# Patient Record
Sex: Female | Born: 1960 | Race: White | Hispanic: No | Marital: Married | Smoking: Never smoker
Health system: Southern US, Community
[De-identification: ages and names within clinical notes are randomized; demographics above are authoritative.]

## PROBLEM LIST (undated history)

## (undated) DIAGNOSIS — K219 Gastro-esophageal reflux disease without esophagitis: Secondary | ICD-10-CM

## (undated) DIAGNOSIS — F988 Other specified behavioral and emotional disorders with onset usually occurring in childhood and adolescence: Secondary | ICD-10-CM

## (undated) DIAGNOSIS — F419 Anxiety disorder, unspecified: Secondary | ICD-10-CM

## (undated) DIAGNOSIS — D649 Anemia, unspecified: Secondary | ICD-10-CM

## (undated) DIAGNOSIS — I10 Essential (primary) hypertension: Secondary | ICD-10-CM

## (undated) DIAGNOSIS — M5030 Other cervical disc degeneration, unspecified cervical region: Secondary | ICD-10-CM

## (undated) HISTORY — DX: Other specified behavioral and emotional disorders with onset usually occurring in childhood and adolescence: F98.8

## (undated) HISTORY — DX: Anxiety disorder, unspecified: F41.9

## (undated) HISTORY — DX: Anemia, unspecified: D64.9

## (undated) HISTORY — DX: Other cervical disc degeneration, unspecified cervical region: M50.30

## (undated) HISTORY — DX: Essential (primary) hypertension: I10

## (undated) HISTORY — DX: Gastro-esophageal reflux disease without esophagitis: K21.9

## (undated) HISTORY — PX: OTHER SURGICAL HISTORY: SHX169

---

## 1998-03-11 ENCOUNTER — Other Ambulatory Visit: Admission: RE | Admit: 1998-03-11 | Discharge: 1998-03-11 | Payer: Self-pay | Admitting: Obstetrics and Gynecology

## 1999-03-26 ENCOUNTER — Other Ambulatory Visit: Admission: RE | Admit: 1999-03-26 | Discharge: 1999-03-26 | Payer: Self-pay | Admitting: Obstetrics and Gynecology

## 2000-04-27 ENCOUNTER — Other Ambulatory Visit: Admission: RE | Admit: 2000-04-27 | Discharge: 2000-04-27 | Payer: Self-pay | Admitting: Obstetrics and Gynecology

## 2001-02-15 ENCOUNTER — Ambulatory Visit (HOSPITAL_BASED_OUTPATIENT_CLINIC_OR_DEPARTMENT_OTHER): Admission: RE | Admit: 2001-02-15 | Discharge: 2001-02-15 | Payer: Self-pay | Admitting: Orthopedic Surgery

## 2001-02-16 ENCOUNTER — Inpatient Hospital Stay (HOSPITAL_COMMUNITY): Admission: AD | Admit: 2001-02-16 | Discharge: 2001-02-17 | Payer: Self-pay | Admitting: Orthopedic Surgery

## 2001-02-17 ENCOUNTER — Encounter: Payer: Self-pay | Admitting: Orthopedic Surgery

## 2001-05-11 ENCOUNTER — Emergency Department (HOSPITAL_COMMUNITY): Admission: AC | Admit: 2001-05-11 | Discharge: 2001-05-11 | Payer: Self-pay

## 2001-05-15 ENCOUNTER — Other Ambulatory Visit: Admission: RE | Admit: 2001-05-15 | Discharge: 2001-05-15 | Payer: Self-pay | Admitting: Obstetrics and Gynecology

## 2001-05-17 ENCOUNTER — Ambulatory Visit (HOSPITAL_BASED_OUTPATIENT_CLINIC_OR_DEPARTMENT_OTHER): Admission: RE | Admit: 2001-05-17 | Discharge: 2001-05-17 | Payer: Self-pay | Admitting: Orthopedic Surgery

## 2002-05-16 ENCOUNTER — Other Ambulatory Visit: Admission: RE | Admit: 2002-05-16 | Discharge: 2002-05-16 | Payer: Self-pay | Admitting: Obstetrics and Gynecology

## 2003-05-18 ENCOUNTER — Emergency Department (HOSPITAL_COMMUNITY): Admission: EM | Admit: 2003-05-18 | Discharge: 2003-05-19 | Payer: Self-pay | Admitting: Emergency Medicine

## 2003-05-18 ENCOUNTER — Encounter: Payer: Self-pay | Admitting: Emergency Medicine

## 2003-07-11 ENCOUNTER — Other Ambulatory Visit: Admission: RE | Admit: 2003-07-11 | Discharge: 2003-07-11 | Payer: Self-pay | Admitting: Obstetrics and Gynecology

## 2004-09-14 ENCOUNTER — Other Ambulatory Visit: Admission: RE | Admit: 2004-09-14 | Discharge: 2004-09-14 | Payer: Self-pay | Admitting: Obstetrics and Gynecology

## 2004-11-05 ENCOUNTER — Observation Stay (HOSPITAL_COMMUNITY): Admission: RE | Admit: 2004-11-05 | Discharge: 2004-11-06 | Payer: Self-pay | Admitting: Obstetrics and Gynecology

## 2005-06-28 ENCOUNTER — Encounter: Admission: RE | Admit: 2005-06-28 | Discharge: 2005-06-28 | Payer: Self-pay | Admitting: Family Medicine

## 2005-10-21 ENCOUNTER — Other Ambulatory Visit: Admission: RE | Admit: 2005-10-21 | Discharge: 2005-10-21 | Payer: Self-pay | Admitting: Obstetrics and Gynecology

## 2011-06-29 ENCOUNTER — Other Ambulatory Visit: Payer: Self-pay | Admitting: Dermatology

## 2011-07-26 ENCOUNTER — Other Ambulatory Visit: Payer: Self-pay | Admitting: Dermatology

## 2012-10-26 ENCOUNTER — Encounter: Payer: Self-pay | Admitting: Gastroenterology

## 2012-11-09 ENCOUNTER — Ambulatory Visit (AMBULATORY_SURGERY_CENTER): Payer: BC Managed Care – PPO | Admitting: *Deleted

## 2012-11-09 VITALS — Ht 62.0 in | Wt 157.2 lb

## 2012-11-09 DIAGNOSIS — Z1211 Encounter for screening for malignant neoplasm of colon: Secondary | ICD-10-CM

## 2012-11-09 MED ORDER — NA SULFATE-K SULFATE-MG SULF 17.5-3.13-1.6 GM/177ML PO SOLN: ORAL | Status: DC

## 2012-11-13 ENCOUNTER — Encounter: Payer: Self-pay | Admitting: Gastroenterology

## 2012-11-30 ENCOUNTER — Ambulatory Visit (AMBULATORY_SURGERY_CENTER): Payer: BC Managed Care – PPO | Admitting: Gastroenterology

## 2012-11-30 ENCOUNTER — Encounter: Payer: Self-pay | Admitting: Gastroenterology

## 2012-11-30 VITALS — BP 149/100 | HR 76 | Temp 98.6°F | Resp 14 | Ht 62.0 in | Wt 157.0 lb

## 2012-11-30 DIAGNOSIS — Z1211 Encounter for screening for malignant neoplasm of colon: Secondary | ICD-10-CM

## 2012-11-30 MED ORDER — SODIUM CHLORIDE 0.9 % IV SOLN: 500.0000 mL | INTRAVENOUS | Status: DC

## 2012-11-30 NOTE — Patient Instructions (Addendum)

## 2012-11-30 NOTE — Progress Notes (Signed)
Patient did not experience any of the following events: a burn prior to discharge; a fall within the facility; wrong site/side/patient/procedure/implant event; or a hospital transfer or hospital admission upon discharge from the facility. (G8907) Patient did not have preoperative order for IV antibiotic SSI prophylaxis. (G8918)  

## 2012-11-30 NOTE — Op Note (Signed)
[facility] [address] [place], [address]   COLONOSCOPY PROCEDURE REPORT  PATIENT: Cassandra Barnett, Cassandra Barnett  MR#: [id number] BIRTHDATE: 08-Aug-1961 , 51  yrs. old GENDER: Female ENDOSCOPIST: Meryl Dare, MD, [facility] REFERRED OZ:HYQMV Kevan Ny, M.D. PROCEDURE DATE:  11/30/2012 PROCEDURE:   Colonoscopy, screening ASA CLASS:   Class II INDICATIONS:average risk screening. MEDICATIONS: MAC sedation, administered by CRNA, propofol (Diprivan) 300mg  IV DESCRIPTION OF PROCEDURE:   After the risks benefits and alternatives of the procedure were thoroughly explained, informed consent was obtained.  A digital rectal exam revealed no abnormalities of the rectum.   The LB [id number]  endoscope was introduced through the anus and advanced to the cecum, which was identified by both the appendix and ileocecal valve. No adverse events experienced.   The quality of the prep was good, using MoviPrep  The instrument was then slowly withdrawn as the colon was fully examined.  COLON FINDINGS: Mild diverticulosis was noted in the sigmoid colon. The colon was otherwise normal.  There was no diverticulosis, inflammation, polyps or cancers unless previously stated. Retroflexed views revealed no abnormalities. The time to cecum=3 minutes 23 seconds.  Withdrawal time=10 minutes 55 seconds.  The scope was withdrawn and the procedure completed.  COMPLICATIONS: There were no complications.  ENDOSCOPIC IMPRESSION: 1.   Mild diverticulosis was noted in the sigmoid colon 2.   The colon was otherwise normal  RECOMMENDATIONS: 1.  High fiber diet with liberal fluid intake. 2.  Continue current colorectal screening recommendations for "routine risk" patients with a repeat colonoscopy in 10 years.   eSigned:  Meryl Dare, MD, [facility] 11/30/2012 11:17 AM

## 2012-12-01 ENCOUNTER — Telehealth: Payer: Self-pay | Admitting: *Deleted

## 2012-12-01 NOTE — Telephone Encounter (Signed)
  Follow up Call-  Call back number 11/30/2012  Post procedure Call Back phone  # [phone]  Permission to leave phone message Yes     Patient questions:  Do you have a fever, pain , or abdominal swelling? no Pain Score  0 *  Have you tolerated food without any problems? yes  Have you been able to return to your normal activities? yes  Do you have any questions about your discharge instructions: Diet   no Medications  no Follow up visit  no  Do you have questions or concerns about your Care? no  Actions: * If pain score is 4 or above: No action needed, pain <4.

## 2013-07-18 ENCOUNTER — Other Ambulatory Visit: Payer: Self-pay | Admitting: Dermatology

## 2013-10-21 ENCOUNTER — Encounter: Payer: Self-pay | Admitting: *Deleted

## 2013-10-21 DIAGNOSIS — I10 Essential (primary) hypertension: Secondary | ICD-10-CM

## 2013-10-21 DIAGNOSIS — F419 Anxiety disorder, unspecified: Secondary | ICD-10-CM

## 2014-10-15 ENCOUNTER — Other Ambulatory Visit: Payer: Self-pay | Admitting: Obstetrics and Gynecology

## 2014-10-16 LAB — CYTOLOGY - PAP

## 2016-10-04 DIAGNOSIS — G4700 Insomnia, unspecified: Secondary | ICD-10-CM | POA: Diagnosis not present

## 2016-10-04 DIAGNOSIS — I10 Essential (primary) hypertension: Secondary | ICD-10-CM | POA: Diagnosis not present

## 2016-10-04 DIAGNOSIS — E7800 Pure hypercholesterolemia, unspecified: Secondary | ICD-10-CM | POA: Diagnosis not present

## 2016-12-13 DIAGNOSIS — Z01419 Encounter for gynecological examination (general) (routine) without abnormal findings: Secondary | ICD-10-CM | POA: Diagnosis not present

## 2017-02-23 DIAGNOSIS — D225 Melanocytic nevi of trunk: Secondary | ICD-10-CM | POA: Diagnosis not present

## 2017-02-23 DIAGNOSIS — L821 Other seborrheic keratosis: Secondary | ICD-10-CM | POA: Diagnosis not present

## 2017-02-23 DIAGNOSIS — L814 Other melanin hyperpigmentation: Secondary | ICD-10-CM | POA: Diagnosis not present

## 2017-04-04 DIAGNOSIS — E7800 Pure hypercholesterolemia, unspecified: Secondary | ICD-10-CM | POA: Diagnosis not present

## 2017-04-04 DIAGNOSIS — I10 Essential (primary) hypertension: Secondary | ICD-10-CM | POA: Diagnosis not present

## 2017-06-13 DIAGNOSIS — M79644 Pain in right finger(s): Secondary | ICD-10-CM | POA: Diagnosis not present

## 2017-06-13 DIAGNOSIS — S61212D Laceration without foreign body of right middle finger without damage to nail, subsequent encounter: Secondary | ICD-10-CM | POA: Diagnosis not present

## 2017-06-13 DIAGNOSIS — S61212A Laceration without foreign body of right middle finger without damage to nail, initial encounter: Secondary | ICD-10-CM | POA: Diagnosis not present

## 2017-06-21 DIAGNOSIS — M79644 Pain in right finger(s): Secondary | ICD-10-CM | POA: Diagnosis not present

## 2017-06-21 DIAGNOSIS — S61212A Laceration without foreign body of right middle finger without damage to nail, initial encounter: Secondary | ICD-10-CM | POA: Diagnosis not present

## 2017-10-06 DIAGNOSIS — E7800 Pure hypercholesterolemia, unspecified: Secondary | ICD-10-CM | POA: Diagnosis not present

## 2017-10-06 DIAGNOSIS — I10 Essential (primary) hypertension: Secondary | ICD-10-CM | POA: Diagnosis not present

## 2017-12-20 DIAGNOSIS — Z6829 Body mass index (BMI) 29.0-29.9, adult: Secondary | ICD-10-CM | POA: Diagnosis not present

## 2017-12-20 DIAGNOSIS — Z01419 Encounter for gynecological examination (general) (routine) without abnormal findings: Secondary | ICD-10-CM | POA: Diagnosis not present

## 2018-01-11 DIAGNOSIS — R609 Edema, unspecified: Secondary | ICD-10-CM | POA: Diagnosis not present

## 2018-02-08 DIAGNOSIS — M5412 Radiculopathy, cervical region: Secondary | ICD-10-CM | POA: Diagnosis not present

## 2018-04-06 DIAGNOSIS — I10 Essential (primary) hypertension: Secondary | ICD-10-CM | POA: Diagnosis not present

## 2018-04-06 DIAGNOSIS — E7800 Pure hypercholesterolemia, unspecified: Secondary | ICD-10-CM | POA: Diagnosis not present

## 2018-06-14 DIAGNOSIS — T783XXA Angioneurotic edema, initial encounter: Secondary | ICD-10-CM | POA: Diagnosis not present

## 2018-06-24 DIAGNOSIS — Z23 Encounter for immunization: Secondary | ICD-10-CM | POA: Diagnosis not present

## 2018-08-02 DIAGNOSIS — D225 Melanocytic nevi of trunk: Secondary | ICD-10-CM | POA: Diagnosis not present

## 2018-08-02 DIAGNOSIS — L814 Other melanin hyperpigmentation: Secondary | ICD-10-CM | POA: Diagnosis not present

## 2018-08-02 DIAGNOSIS — L821 Other seborrheic keratosis: Secondary | ICD-10-CM | POA: Diagnosis not present

## 2018-09-25 DIAGNOSIS — Z23 Encounter for immunization: Secondary | ICD-10-CM | POA: Diagnosis not present

## 2018-10-09 DIAGNOSIS — G4700 Insomnia, unspecified: Secondary | ICD-10-CM | POA: Diagnosis not present

## 2018-10-09 DIAGNOSIS — I10 Essential (primary) hypertension: Secondary | ICD-10-CM | POA: Diagnosis not present

## 2019-01-04 DIAGNOSIS — Z01419 Encounter for gynecological examination (general) (routine) without abnormal findings: Secondary | ICD-10-CM | POA: Diagnosis not present

## 2019-01-04 DIAGNOSIS — Z6829 Body mass index (BMI) 29.0-29.9, adult: Secondary | ICD-10-CM | POA: Diagnosis not present

## 2019-01-09 DIAGNOSIS — L989 Disorder of the skin and subcutaneous tissue, unspecified: Secondary | ICD-10-CM | POA: Diagnosis not present

## 2019-01-09 DIAGNOSIS — B078 Other viral warts: Secondary | ICD-10-CM | POA: Diagnosis not present

## 2019-04-18 ENCOUNTER — Other Ambulatory Visit: Payer: Self-pay

## 2019-04-18 DIAGNOSIS — Z20822 Contact with and (suspected) exposure to covid-19: Secondary | ICD-10-CM

## 2019-04-20 ENCOUNTER — Telehealth: Payer: Self-pay | Admitting: Family Medicine

## 2019-04-20 LAB — NOVEL CORONAVIRUS, NAA: SARS-CoV-2, NAA: NOT DETECTED

## 2019-04-20 NOTE — Telephone Encounter (Signed)
Negative COVID results given. Patient results "NOT Detected." Caller expressed understanding. ° °

## 2019-06-01 ENCOUNTER — Ambulatory Visit
Admission: RE | Admit: 2019-06-01 | Discharge: 2019-06-01 | Disposition: A | Discharge status: Home / Self Care | Payer: BC Managed Care – PPO | Source: Ambulatory Visit | Attending: Family Medicine | Admitting: Family Medicine

## 2019-06-01 ENCOUNTER — Other Ambulatory Visit: Payer: Self-pay | Admitting: Family Medicine

## 2019-06-01 ENCOUNTER — Other Ambulatory Visit: Payer: Self-pay

## 2019-06-01 DIAGNOSIS — S9031XA Contusion of right foot, initial encounter: Secondary | ICD-10-CM

## 2019-09-20 ENCOUNTER — Other Ambulatory Visit: Payer: BC Managed Care – PPO

## 2019-11-11 ENCOUNTER — Ambulatory Visit: Payer: 59 | Attending: Internal Medicine

## 2019-11-11 DIAGNOSIS — Z23 Encounter for immunization: Secondary | ICD-10-CM | POA: Insufficient documentation

## 2019-11-11 NOTE — Progress Notes (Signed)
   Covid-19 Vaccination Clinic  Name:  Cassandra Barnett    MRN: [id number] DOB: [birth year]  11/11/2019  [name] was observed post Covid-19 immunization for 15 minutes without incident. She was provided with Vaccine Information Sheet and instruction to access the V-Safe system.   [name] was instructed to call 911 with any severe reactions post vaccine: Marland Kitchen Difficulty breathing  . Swelling of face and throat  . A fast heartbeat  . A bad rash all over body  . Dizziness and weakness   Immunizations Administered    Name Date Dose VIS Date Route   Pfizer COVID-19 Vaccine 11/11/2019  3:35 PM 0.3 mL 08/17/2019 Intramuscular   Manufacturer: [place]   Lot: [id number]   [place]: [id number]

## 2019-12-11 ENCOUNTER — Ambulatory Visit: Payer: 59 | Attending: Internal Medicine

## 2019-12-11 DIAGNOSIS — Z23 Encounter for immunization: Secondary | ICD-10-CM

## 2019-12-11 NOTE — Progress Notes (Signed)
   Covid-19 Vaccination Clinic  Name:  Cassandra Barnett    MRN: [id number] DOB: Jan 22, 1961  12/11/2019  [name] was observed post Covid-19 immunization for 15 minutes without incident. She was provided with Vaccine Information Sheet and instruction to access the V-Safe system.   [name] was instructed to call 911 with any severe reactions post vaccine: Marland Kitchen Difficulty breathing  . Swelling of face and throat  . A fast heartbeat  . A bad rash all over body  . Dizziness and weakness   Immunizations Administered    Name Date Dose VIS Date Route   Pfizer COVID-19 Vaccine 12/11/2019  1:36 PM 0.3 mL 08/17/2019 Intramuscular   Manufacturer: [place]   Lot: [id number]   [place]: [id number]

## 2019-12-24 ENCOUNTER — Ambulatory Visit: Payer: Self-pay | Admitting: *Deleted

## 2019-12-24 NOTE — Telephone Encounter (Signed)
Patient called [facility]- thinking it is still UC- advised now Biomedical engineer. Patient advised ED and she got upset and said she would have to wait too long- patient said she would find somewhere else and hung up.  Reason for Disposition . [1] Cut (length > 1/8 inch or 3 mm) or skin tear AND [2] any animal  Answer Assessment - Initial Assessment Questions 1. ANIMAL: "What type of animal caused the bite?" "Is the injury from a bite or a claw?" If the animal is a dog or a cat, ask: "Was it a pet or a stray?" "Was it acting ill or behaving strangely?"     Dog bite- patient has rescue dog- not acting strangely 2. LOCATION: "Where is the bite located?"      L arm and hand 3. SIZE: "How big is the bite?" "What does it look like?"      More than one- tears in the skin 4. ONSET: "When did the bite happen?" (Minutes or hours ago)      6:30 last night 5. CIRCUMSTANCES: "Tell me how this happened."      Rescue dog- patient reached for dog- she went to redirect dog 6. TETANUS: "When was the last tetanus booster?"     Not been long 7. PREGNANCY: "Is there any chance you are pregnant?" "When was your last menstrual period?"     n/a  Protocols used: ANIMAL BITE-A-AH

## 2020-08-06 IMAGING — CR DG FOOT COMPLETE 3+V*R*
3 series · 3 of 3 positions shown · non-contrast
Comparison: None.

CLINICAL DATA: Right foot contusion, pain in the third and fourth
metatarsal heads

EXAM:
RIGHT FOOT COMPLETE - 3+ VIEW

[x foot ap right]
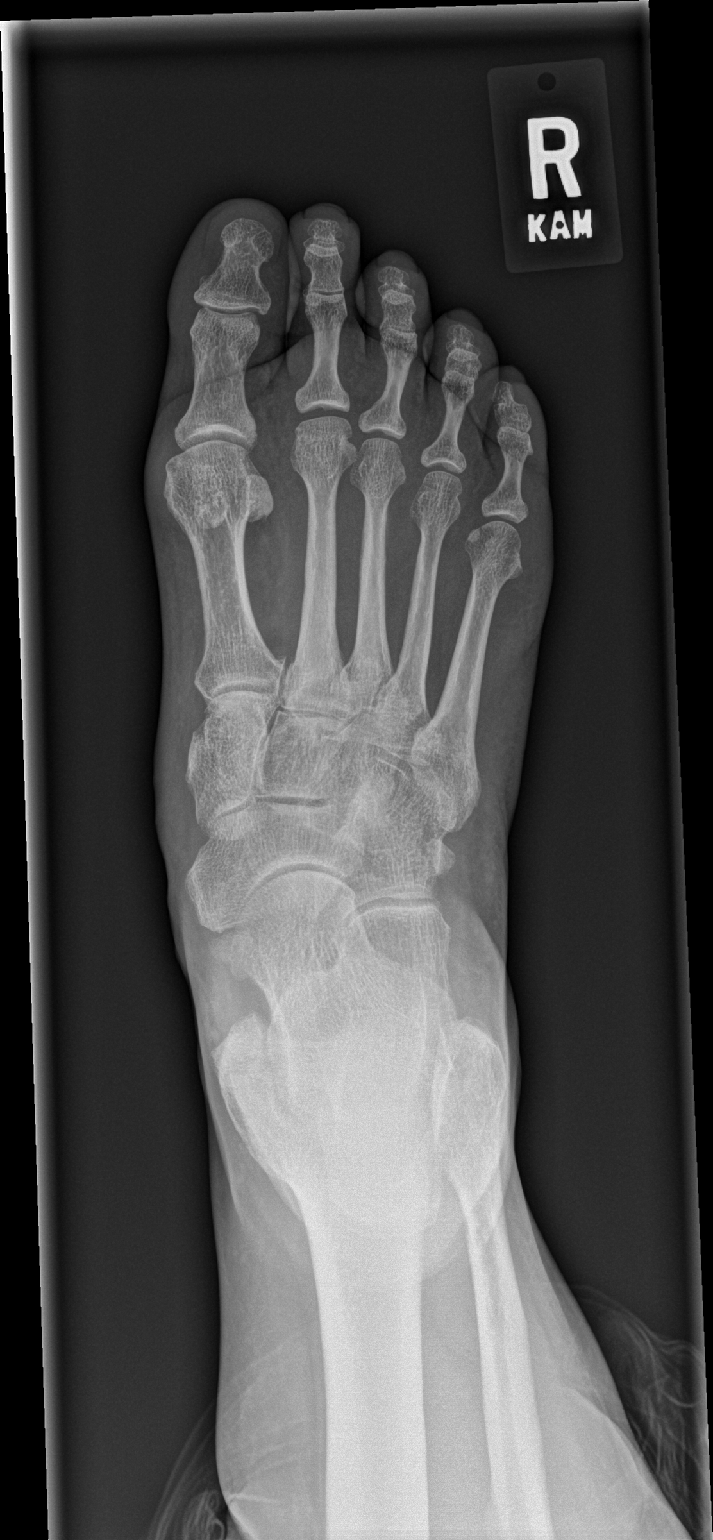

[x foot obl right]
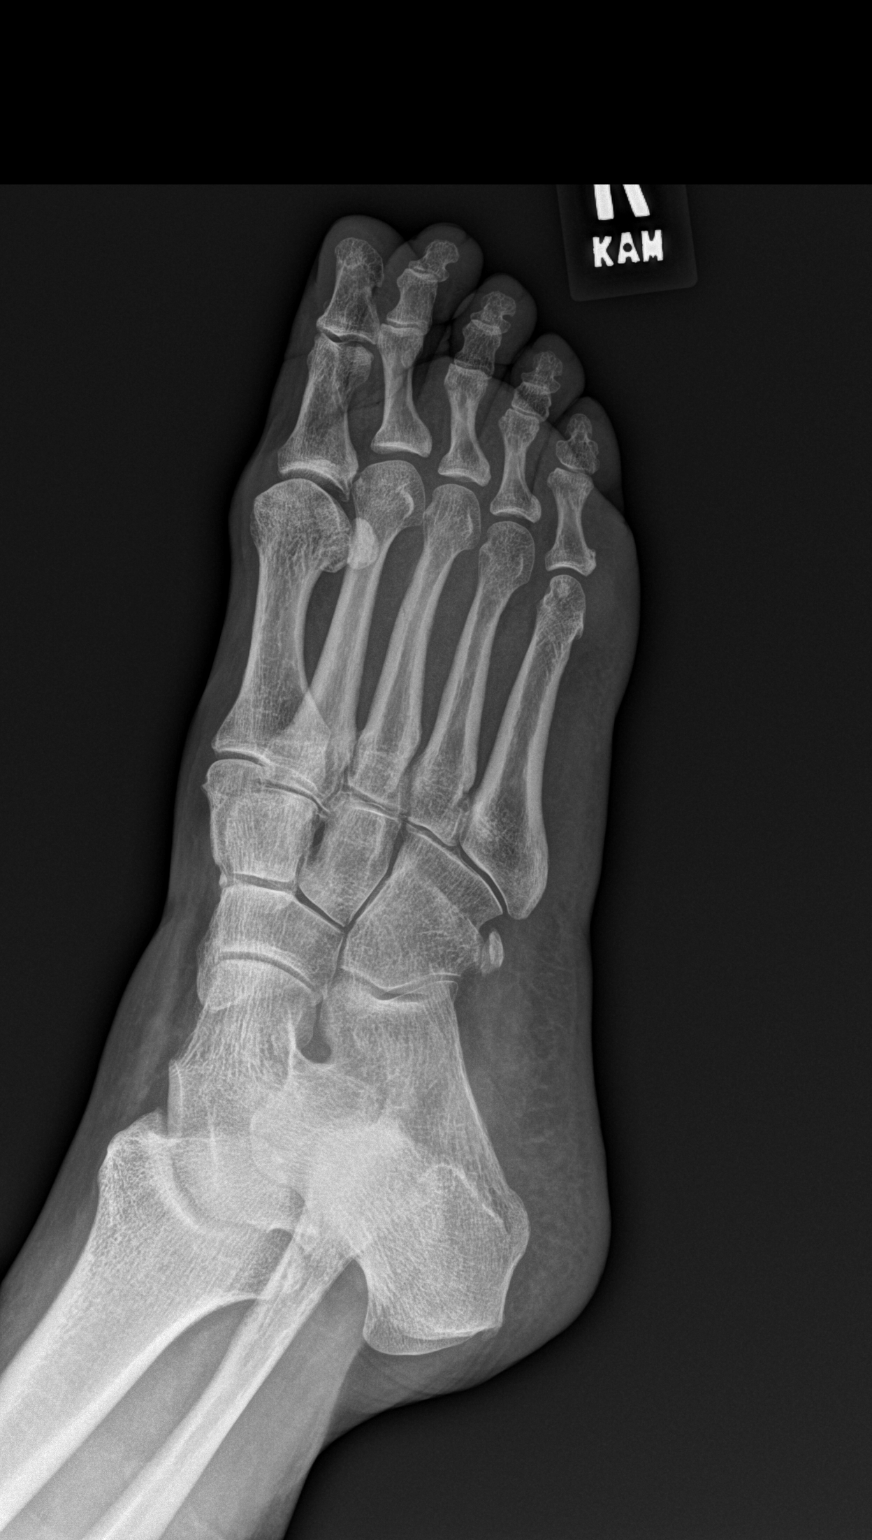

[x foot lat right]
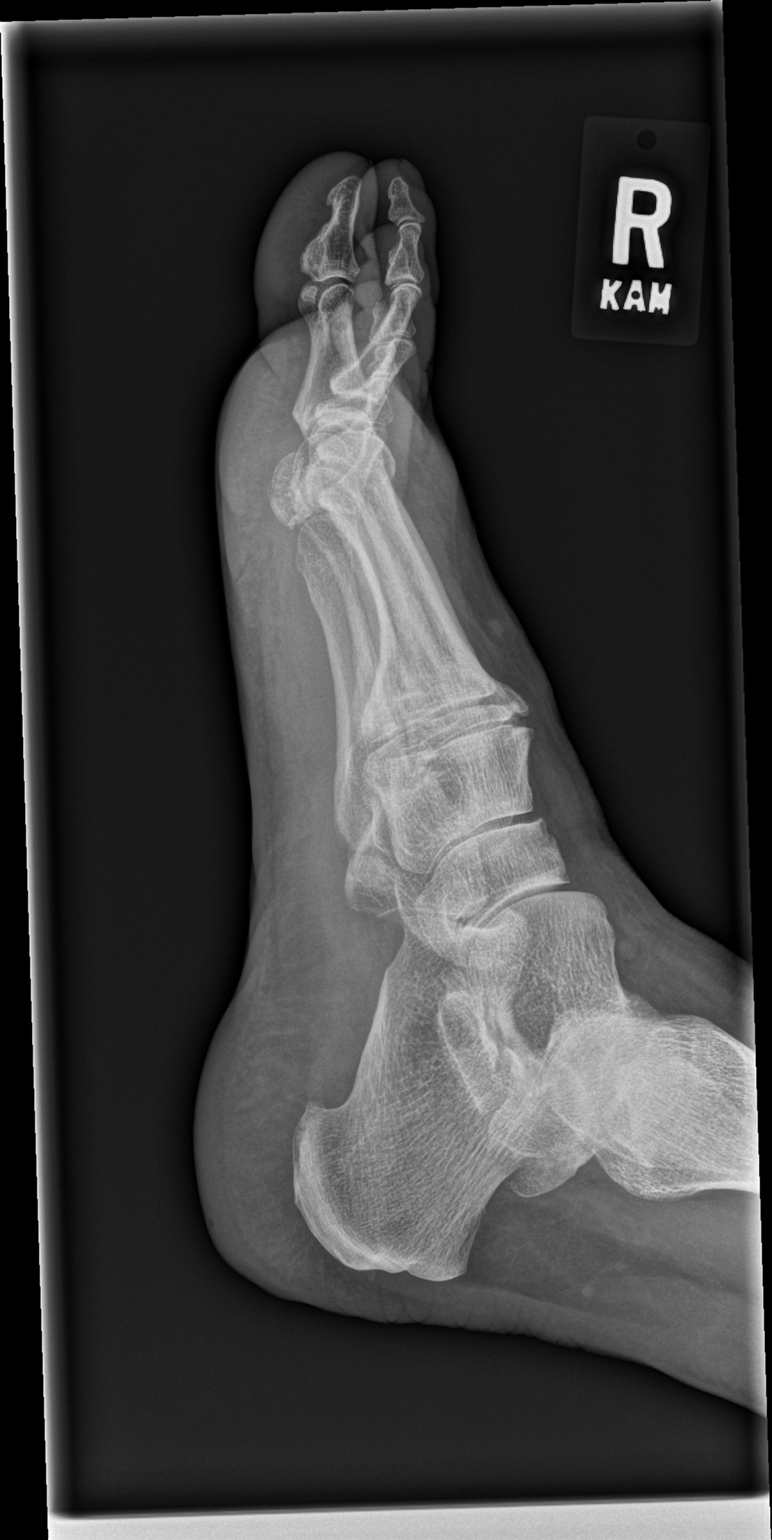

[3 of 3 positions shown; findings below may reference images not displayed]

FINDINGS: No definite fracture or dislocation of the right foot. On oblique
view, there is a very subtle, oblique lucency of the medial aspect
of the cortex of the right third metatarsal. Mild first
metatarsophalangeal and midfoot arthrosis. Diffuse soft tissue edema
of the forefoot.
IMPRESSION: 1. No definite fracture or dislocation of the right foot. On oblique
view, there is a very subtle, oblique lucency of the medial aspect
of the cortex of the right third metatarsal. This is suspicious for
subtle nondisplaced fracture versus nutrient foramen.

2.  Diffuse soft tissue edema of the forefoot.

## 2022-02-24 ENCOUNTER — Other Ambulatory Visit: Payer: Self-pay

## 2022-02-25 ENCOUNTER — Ambulatory Visit: Payer: 59 | Admitting: Nurse Practitioner

## 2022-02-25 ENCOUNTER — Encounter: Payer: Self-pay | Admitting: Nurse Practitioner

## 2022-02-25 VITALS — BP 110/80 | HR 80 | Ht 61.0 in | Wt 174.5 lb

## 2022-02-25 DIAGNOSIS — D500 Iron deficiency anemia secondary to blood loss (chronic): Secondary | ICD-10-CM

## 2022-02-25 MED ORDER — PLENVU 140 G PO SOLR: 140.0000 g | ORAL | 0 refills | Status: DC

## 2022-02-25 NOTE — Patient Instructions (Addendum)
If you are age 60 or older, your body mass index should be between 23-30. Your Body mass index is 32.97 kg/m. If this is out of the aforementioned range listed, please consider follow up with your Primary Care Provider.  If you are age 72 or younger, your body mass index should be between 19-25. Your Body mass index is 32.97 kg/m. If this is out of the aformentioned range listed, please consider follow up with your Primary Care Provider.   ________________________________________________________  The [facility] providers would like to encourage you to use [facility] to communicate with providers for non-urgent requests or questions.  Due to long hold times on the telephone, sending your provider a message by  [facility] may be a faster and more efficient way to get a response.  Please allow 48 business hours for a response.  Please remember that this is for non-urgent requests.  _______________________________________________________  Dennis Bast have been scheduled for an endoscopy. Please follow written instructions given to you at your visit today. If you use inhalers (even only as needed), please bring them with you on the day of your procedure.   Hold Iron supplements today.  Clear liquids for the remainder of the day.   It was a pleasure to see you today!  Thank you for trusting me with your gastrointestinal care!

## 2022-02-25 NOTE — Progress Notes (Signed)
Reviewed and agree with management plans. ? ?Liam Cammarata L. Darnita Woodrum, MD, MPH  ?

## 2022-02-25 NOTE — Progress Notes (Deleted)
Chief Complaint:    Assessment & Plan      HPI:       Interval history:     Previous GI Evaluation     Imaging:  DG Foot Complete Right CLINICAL DATA:  Right foot contusion, pain in the third and fourth metatarsal heads  EXAM: RIGHT FOOT COMPLETE - 3+ VIEW  COMPARISON:  None.  FINDINGS: No definite fracture or dislocation of the right foot. On oblique view, there is a very subtle, oblique lucency of the medial aspect of the cortex of the right third metatarsal. Mild first metatarsophalangeal and midfoot arthrosis. Diffuse soft tissue edema of the forefoot.  IMPRESSION: 1. No definite fracture or dislocation of the right foot. On oblique view, there is a very subtle, oblique lucency of the medial aspect of the cortex of the right third metatarsal. This is suspicious for subtle nondisplaced fracture versus nutrient foramen.  2.  Diffuse soft tissue edema of the forefoot.  Electronically Signed   By: Eddie Candle M.D.   On: 06/01/2019 16:42    Past Medical History:  Diagnosis Date   ADD (attention deficit disorder)    Anxiety    Degenerative disc disease, cervical    GERD (gastroesophageal reflux disease)    Hypertension    Past Surgical History:  Procedure Laterality Date   bladder tack     Family History  Problem Relation Age of Onset   Colon cancer Neg Hx    Social History   Tobacco Use   Smoking status: Never   Smokeless tobacco: Never  Substance Use Topics   Alcohol use: Yes    Alcohol/week: 5.0 standard drinks of alcohol    Types: 5 Glasses of wine per week   Drug use: No   Current Outpatient Medications  Medication Sig Dispense Refill   ALPRAZolam (XANAX XR PO) Take by mouth as needed.     Ascorbic Acid (VITAMIN C DROPS MT) Use as directed in the mouth or throat as needed.     clotrimazole-betamethasone (LOTRISONE) cream clotrimazole-betamethasone 1 %-0.05 % topical cream  APPLY TO THE AFFECTED AND SURROUNDING AREAS OF  SKIN BY TOPICAL ROUTE 2 to 3 TIMES PER DAY AS NEEDED     escitalopram (LEXAPRO) 20 MG tablet escitalopram 20 mg tablet     lisinopril-hydrochlorothiazide (PRINZIDE,ZESTORETIC) 20-25 MG per tablet      zolpidem (AMBIEN) 10 MG tablet Take 10 mg by mouth at bedtime as needed for sleep.     No current facility-administered medications for this visit.   Allergies  Allergen Reactions   Amoxicillin Rash     Review of Systems: Positive for ***.  All other systems reviewed and negative except where noted in HPI.   Wt Readings from Last 3 Encounters:  11/30/12 157 lb (71.2 kg)  11/09/12 157 lb 3.2 oz (71.3 kg)    Physical Exam   There were no vitals taken for this visit. Constitutional:  Generally well appearing ***female in no acute distress. Psychiatric: Pleasant. Normal mood and affect. Behavior is normal. EENT: Pupils normal.  Conjunctivae are normal. No scleral icterus. Neck supple.  Cardiovascular: Normal rate, regular rhythm. No edema Pulmonary/chest: Effort normal and breath sounds normal. No wheezing, rales or rhonchi. Abdominal: Soft, nondistended, nontender. Bowel sounds active throughout. There are no masses palpable. No hepatomegaly. Neurological: Alert and oriented to person place and time. Skin: Skin is warm and dry. No rashes noted.  Tye Savoy, NP  02/25/2022, 11:09 AM  Cc:  Referring Provider  Donald Prose, MD

## 2022-02-25 NOTE — Progress Notes (Signed)
Chief Complaint:  anemia   Assessment & Plan   61 year old female with past medical history significant for hypertension, anxiety and new iron deficiency anemia  Microcytic anemia / hemoccult positive stool. I don't have all of her labs but she is microcytic with a hgb of 9.3, down from 12 in January TIBC is elevated. Percent iron sat is normal but lab drawn after starting oral iron. Rule out PUD, intestinal AVMS, colon neoplasm. Positive hemoccults could be from perianal source and not relevant to the anemia so need to consider celiac disease and also IDA related to chronic blood donations.  Needs EGD and colonoscopy. The risks and benefits of EGD and colonoscopy with possible biopsies were discussed with the patient who agrees to proceed.  Patient known to Dr. Fuller Plan but Dr. Tarri Glenn has an opening for EGD / colonoscopy tomorrow so in  the interest of time will take that appointment If she doesn't respond to oral iron consider referral to Hematology for IDA managment   HPI:    Patient is known to Dr. Fuller Plan. She had a normal screening colonoscopy in 2014. She is referred by PCP Dr. Nancy Fetter for iron deficiency anemia and FOBT+.   Patient had a physical within the few weeks. Labs on 02/10/22 show hgb 9.3, mcv 75.1, RDW elevated. Normal BUN and creatinine. Two of 3 hemoccults were positive. In Jan 2023 her hgb was 12,  but MCV was low at 19 at the time . I don't have results of ferritin  Follow up labs a couple of weeks after starting iron show serum iron 180, TIBC 490, iron sat 37%, transferrin 350.   Chelsia hasn't seen any blood in her stool. She takes Ibuprofen once a week or less. She has had some random hot flashes followed by nausea / vomiting. This happens  every few weeks but again it is very random. Bowel movements are normal except on the loose side and dark after iron. She has fatigue but no other complaints.   She hasn't had a menstrual cycle in many years. No hematuria,no vaginal  bleeding. She does donate blood every six months and has done so for years.   No known [place] of celiac disease or colon cancer  Past Medical History:  Diagnosis Date   ADD (attention deficit disorder)    Anxiety    Degenerative disc disease, cervical    GERD (gastroesophageal reflux disease)    Hypertension    Past Surgical History:  Procedure Laterality Date   bladder tack     Family History  Problem Relation Age of Onset   Colon cancer Neg Hx    Social History   Tobacco Use   Smoking status: Never   Smokeless tobacco: Never  Substance Use Topics   Alcohol use: Yes    Alcohol/week: 5.0 standard drinks of alcohol    Types: 5 Glasses of wine per week   Drug use: No   Current Outpatient Medications  Medication Sig Dispense Refill   ALPRAZolam (XANAX XR PO) Take by mouth as needed.     Ascorbic Acid (VITAMIN C DROPS MT) Use as directed in the mouth or throat as needed.     clotrimazole-betamethasone (LOTRISONE) cream clotrimazole-betamethasone 1 %-0.05 % topical cream  APPLY TO THE AFFECTED AND SURROUNDING AREAS OF SKIN BY TOPICAL ROUTE 2 to 3 TIMES PER DAY AS NEEDED     escitalopram (LEXAPRO) 20 MG tablet escitalopram 20 mg tablet     lisinopril-hydrochlorothiazide (PRINZIDE,ZESTORETIC) 20-25 MG per  tablet      zolpidem (AMBIEN) 10 MG tablet Take 10 mg by mouth at bedtime as needed for sleep.     No current facility-administered medications for this visit.   Allergies  Allergen Reactions   Amoxicillin Rash     Review of Systems: All systems reviewed and negative except where noted in HPI.   Wt Readings from Last 3 Encounters:  11/30/12 157 lb (71.2 kg)  11/09/12 157 lb 3.2 oz (71.3 kg)    Physical Exam   BP 110/80 (BP Location: Left Arm, Patient Position: Sitting, Cuff Size: Normal)   Pulse 80   Ht '5\' 1"'$  (1.549 m) Comment: height measured without shoes  Wt 174 lb 8 oz (79.2 kg)   LMP 10/25/2012   BMI 32.97 kg/m  Constitutional:  Generally well appearing  fe female in no acute distress. Psychiatric: Pleasant. Normal mood and affect. Behavior is normal. EENT: Pupils normal.  Conjunctivae are normal. No scleral icterus. Neck supple.  Cardiovascular: Normal rate, regular rhythm. No edema Pulmonary/chest: Effort normal and breath sounds normal. No wheezing, rales or rhonchi. Abdominal: Soft, nondistended, nontender. Bowel sounds active throughout. There are no masses palpable. No hepatomegaly. Neurological: Alert and oriented to person place and time. Skin: Skin is warm and dry. No rashes noted.  Tye Savoy, NP  02/25/2022, 11:11 AM  Cc:  Referring Provider Donald Prose, MD

## 2022-02-26 ENCOUNTER — Telehealth: Payer: Self-pay

## 2022-02-26 ENCOUNTER — Ambulatory Visit (AMBULATORY_SURGERY_CENTER): Payer: 59 | Admitting: Gastroenterology

## 2022-02-26 ENCOUNTER — Encounter: Payer: Self-pay | Admitting: Gastroenterology

## 2022-02-26 VITALS — BP 133/77 | HR 76 | Temp 97.7°F | Resp 15 | Ht 61.0 in | Wt 174.0 lb

## 2022-02-26 DIAGNOSIS — R195 Other fecal abnormalities: Secondary | ICD-10-CM

## 2022-02-26 DIAGNOSIS — D123 Benign neoplasm of transverse colon: Secondary | ICD-10-CM | POA: Diagnosis not present

## 2022-02-26 DIAGNOSIS — K2950 Unspecified chronic gastritis without bleeding: Secondary | ICD-10-CM | POA: Diagnosis not present

## 2022-02-26 DIAGNOSIS — K5730 Diverticulosis of large intestine without perforation or abscess without bleeding: Secondary | ICD-10-CM | POA: Diagnosis not present

## 2022-02-26 DIAGNOSIS — D500 Iron deficiency anemia secondary to blood loss (chronic): Secondary | ICD-10-CM

## 2022-02-26 DIAGNOSIS — D125 Benign neoplasm of sigmoid colon: Secondary | ICD-10-CM

## 2022-02-26 DIAGNOSIS — K2100 Gastro-esophageal reflux disease with esophagitis, without bleeding: Secondary | ICD-10-CM

## 2022-02-26 DIAGNOSIS — D120 Benign neoplasm of cecum: Secondary | ICD-10-CM

## 2022-02-26 DIAGNOSIS — K209 Esophagitis, unspecified without bleeding: Secondary | ICD-10-CM | POA: Diagnosis not present

## 2022-02-26 DIAGNOSIS — K2970 Gastritis, unspecified, without bleeding: Secondary | ICD-10-CM

## 2022-02-26 MED ORDER — SODIUM CHLORIDE 0.9 % IV SOLN: 500.0000 mL | Freq: Once | INTRAVENOUS | Status: AC

## 2022-02-26 MED ORDER — PANTOPRAZOLE SODIUM 40 MG PO TBEC: 40.0000 mg | DELAYED_RELEASE_TABLET | Freq: Every day | ORAL | 3 refills | Status: AC

## 2022-02-26 NOTE — Progress Notes (Signed)
Indication for upper endoscopy and colonoscopy: Iron deficiency anemia due to chronic blood loss  Please see the office note from 02/25/2022 for complete details.  There is been no change in history or physical exam since that time.  The patient remains an appropriate candidate for monitored anesthesia care in the [facility].

## 2022-02-26 NOTE — Progress Notes (Signed)
Pt. Inquired if she could continue Iron 325 mg. 2 times daily.  Relayed to pt. That Dr. Orvan Falconer said to continue iron.

## 2022-02-26 NOTE — Progress Notes (Signed)
Called to room to assist during endoscopic procedure.  Patient ID and intended procedure confirmed with present staff. Received instructions for my participation in the procedure from the performing physician.  

## 2022-03-01 ENCOUNTER — Telehealth: Payer: Self-pay

## 2022-03-10 ENCOUNTER — Encounter: Payer: Self-pay | Admitting: Gastroenterology

## 2022-03-26 ENCOUNTER — Encounter: Payer: Self-pay | Admitting: Nurse Practitioner

## 2022-03-26 ENCOUNTER — Other Ambulatory Visit (INDEPENDENT_AMBULATORY_CARE_PROVIDER_SITE_OTHER): Payer: 59

## 2022-03-26 ENCOUNTER — Ambulatory Visit: Payer: 59 | Admitting: Nurse Practitioner

## 2022-03-26 VITALS — BP 140/90 | HR 76 | Ht 61.0 in | Wt 171.5 lb

## 2022-03-26 DIAGNOSIS — K2100 Gastro-esophageal reflux disease with esophagitis, without bleeding: Secondary | ICD-10-CM

## 2022-03-26 DIAGNOSIS — D500 Iron deficiency anemia secondary to blood loss (chronic): Secondary | ICD-10-CM | POA: Diagnosis not present

## 2022-03-26 LAB — IBC + FERRITIN
Ferritin: 28 ng/mL (ref 10.0–291.0)
Iron: 80 ug/dL (ref 42–145)
Saturation Ratios: 16.9 % — ABNORMAL LOW (ref 20.0–50.0)
TIBC: 474.6 ug/dL — ABNORMAL HIGH (ref 250.0–450.0)
Transferrin: 339 mg/dL (ref 212.0–360.0)

## 2022-03-26 LAB — CBC
HCT: 42.5 % (ref 36.0–46.0)
Hemoglobin: 14 g/dL (ref 12.0–15.0)
MCHC: 32.9 g/dL (ref 30.0–36.0)
MCV: 88.9 fl (ref 78.0–100.0)
Platelets: 216 10*3/uL (ref 150.0–400.0)
RBC: 4.78 Mil/uL (ref 3.87–5.11)
RDW: 24.9 % — ABNORMAL HIGH (ref 11.5–15.5)
WBC: 6.6 10*3/uL (ref 4.0–10.5)

## 2022-03-26 NOTE — Patient Instructions (Addendum)
Starting 04/30/22 may decrease Pantoprazole to 40 mg daily.   Your provider has requested that you go to the basement level for lab work before leaving today. Press "B" on the elevator. The lab is located at the first door on the left as you exit the elevator.  If you are age 60 or older, your body mass index should be between 23-30. Your Body mass index is 32.4 kg/m. If this is out of the aforementioned range listed, please consider follow up with your Primary Care Provider.  If you are age 42 or younger, your body mass index should be between 19-25. Your Body mass index is 32.4 kg/m. If this is out of the aformentioned range listed, please consider follow up with your Primary Care Provider.   ________________________________________________________  The [facility] providers would like to encourage you to use [facility] to communicate with providers for non-urgent requests or questions.  Due to long hold times on the telephone, sending your provider a message by [facility] may be a faster and more efficient way to get a response.  Please allow 48 business hours for a response.  Please remember that this is for non-urgent requests.  _______________________________________________________

## 2022-03-26 NOTE — Progress Notes (Signed)
Chief Complaint: Iron deficiency anemia, follow-up after procedures   Assessment &  Plan   Iron deficiency anemia FOBT +. No findings on EGD / colonoscopy to explain decline in hgb from 12 to 9.3 a few months ago. Duodenal biopsies negative for celiac. She donates blood 3-4 times a year which could be contributing factor but doesn't explain FOBT+.  She has been on BID oral iron for several weeks. Will repeat CBC, iron studies to evaluate response.  Recommend she not resume blood donations Continue BID PPI through end of August then resume once daily dosing.   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  Will need 7 year recall.   HPI   61 year-old female with past medical history significant for hypertension, anxiety, esophagitis, diverticulosis and iron deficiency anemia   Cassandra Barnett is known to Dr. Fuller Plan from a normal screening colonoscopy in 2014.  She was seen here 02/25/22 with new iron deficiency anemia, please refer to that office note for details.  To summarize, for evaluation of new IDA patient underwent EGD and colonoscopy ( Dr. Tarri Glenn) on 02/26/22 with findings of grade B esophagitis, gastritis.  Duodenal biopsies were normal.  Gastric biopsy showed mild chronic gastritis, no H. pylori.  Colonoscopy with evaluation of terminal ileum up to 3 cm was remarkable for left-sided diverticulosis, 3 small tubular adenomas.  A 7-year interval colonoscopy was recommended. See endoscopic reports below.    Interval History:  Cassandra Barnett feels fine.  She is taking oral iron twice a day.  As recommended following upper endoscopy she is taking pantoprazole 40 mg twice daily for total of 8 weeks.  The iron is not causing problems with constipation in fact it tends to cause her to have loose stools. She has not donated blood since May.    Previous GI Evaluation   02/26/22 EGD and colonoscopy EGD -LA grade B esophagitis without  bleeding -Gastritis  Colonoscopy with TI intubation to 3 cm - One 2 mm polyp in the sigmoid colon, removed with a cold snare. Resected and retrieved. - One 3 mm polyp in the distal transverse colon, removed with a cold snare. Resected and retrieved. - One less than 1 mm polyp in the cecum, removed with a cold snare. Resected and retrieved. - The examined portion of the ileum was normal. - Small internal and external hemorrhoids. - The examination was otherwise normal on direct and retroflexion views.  Surgical [P], small bowel BENIGN DUODENAL MUCOSA WITH NO DIAGNOSTIC ABNORMALITY 2. 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 PYLORI, INTESTINAL METAPLASIA, DYSPLASIA AND CARCINOMA 3. Surgical [P], esophagus REACTIVE SQUAMOUS MUCOSA WITH FOCAL ACUTE INFLAMMATION SCANT BENIGN FOVEOLAR EPITHELIUM NEGATIVE FOR INTESTINAL METAPLASIA, DYSPLASIA AND CARCINOMA 4. Surgical [P], colon, cecum, polyp (1) HYPERPLASTIC POLYP (SEE MICROSCOPIC COMMENT) NEGATIVE FOR DYSPLASIA AND CARCINOMA 5. Surgical [P], colon, transverse, polyp (1) TUBULAR ADENOMA NEGATIVE FOR HIGH-GRADE DYSPLASIA AND CARCINOMA 6. Surgical [P], colon, sigmoid, polyp (1) TUBULAR ADENOMA NEGATIVE FOR HIGH-GRADE DYSPLASIA AND CARCINOMA    Past Medical History:  Diagnosis Date   ADD (attention deficit disorder)    Anemia    Anxiety    Degenerative disc disease, cervical    GERD (gastroesophageal reflux disease)    Hypertension     Past Surgical History:  Procedure Laterality Date   bladder tack      Current Medications, Allergies, Family History and Social History were reviewed in [facility] record.  Current Outpatient Medications  Medication Sig Dispense Refill   ALPRAZolam (XANAX) 0.25 MG tablet Take 0.25 mg by mouth 3 (three) times daily as needed.     escitalopram (LEXAPRO) 20 MG tablet escitalopram 20 mg tablet      lisinopril-hydrochlorothiazide (PRINZIDE,ZESTORETIC) 20-25 MG per tablet      pantoprazole (PROTONIX) 40 MG tablet Take 1 tablet (40 mg total) by mouth daily. For at least 8 weeks, then resume to 40 mg. Every morning. 60 tablet 3   zolpidem (AMBIEN) 10 MG tablet Take 10 mg by mouth at bedtime as needed for sleep.     Current Facility-Administered Medications  Medication Dose Route Frequency Provider Last Rate Last Admin   0.9 %  sodium chloride infusion  500 mL Intravenous Once [place], MD        Review of Systems: No chest pain. No shortness of breath. No urinary complaints.    Physical Exam  Wt Readings from Last 3 Encounters:  02/26/22 174 lb (78.9 kg)  02/25/22 174 lb 8 oz (79.2 kg)  11/30/12 157 lb (71.2 kg)    BP 140/90 (BP Location: Left Arm, Patient Position: Sitting, Cuff Size: Normal)   Pulse 76   Ht '5\' 1"'$  (1.549 m)   Wt 171 lb 8 oz (77.8 kg)   LMP 10/25/2012   BMI 32.40 kg/m  Constitutional:  Generally well appearing female in no acute distress. Psychiatric: Pleasant. Normal mood and affect. Behavior is normal. EENT: Pupils normal.  Conjunctivae are normal. No scleral icterus. Neck supple.  Cardiovascular: Normal rate, regular rhythm. No edema Pulmonary/chest: Effort normal and breath sounds normal. No wheezing, rales or rhonchi. Abdominal: Soft, nondistended, nontender. Bowel sounds active throughout. There are no masses palpable. No hepatomegaly. Neurological: Alert and oriented to person place and time. Skin: Skin is warm and dry. No rashes noted.  Tye Savoy, NP  03/26/2022, 8:47 AM  Cc:  Donald Prose, MD

## 2022-03-30 ENCOUNTER — Other Ambulatory Visit: Payer: Self-pay

## 2022-03-30 DIAGNOSIS — D500 Iron deficiency anemia secondary to blood loss (chronic): Secondary | ICD-10-CM

## 2022-03-31 ENCOUNTER — Telehealth: Payer: Self-pay | Admitting: Internal Medicine

## 2022-03-31 NOTE — Telephone Encounter (Signed)
Scheduled appt per 7/26 referral. Pt is aware of appt date and time. Pt is aware to arrive 15 mins prior to appt time and to bring and updated insurance card. Pt is aware of appt location.   

## 2022-04-06 NOTE — Telephone Encounter (Signed)
Spoke with pt and let her know Paula's message. Pt stated she wanted to keep appointment where it was.

## 2022-04-16 ENCOUNTER — Ambulatory Visit (INDEPENDENT_AMBULATORY_CARE_PROVIDER_SITE_OTHER): Payer: 59 | Admitting: Gastroenterology

## 2022-04-16 DIAGNOSIS — D500 Iron deficiency anemia secondary to blood loss (chronic): Secondary | ICD-10-CM

## 2022-04-16 NOTE — Patient Instructions (Signed)
You may have clear liquids beginning at 10:30 am after ingesting the capsule.    You can have a light lunch at 12:30 pm; sandwich and half bowl of soup.  Return to the office at 4 pm to return the equipment.   Return to you normal diet at 5 pm.   Call [phone] and ask for Liyat Faulkenberry BSN RN if you have any questions.  You should pass the capsule in your stool 8-48 hours after ingestion. If you have not passed the capsule, after 72 hours, please contact the office at [phone].    

## 2022-04-16 NOTE — Progress Notes (Unsigned)
SN: [id number]  Exp: 08/12/23 LOT: [id number]  Patient arrived for VCE. Reported the prep went well. This RN explained capsule dietary restrictions for the next few hours. Pt advised to return at 4 pm to return capsule equipment.  Patient verbalized understanding. Opened capsule, ensured capsule was flashing prior to the patient swallowing the capsule. Patient swallowed capsule without difficulty.  Patient told to call the office with any questions and if capsule has not passed after 72 hours. No further questions by the conclusion of the visit.

## 2022-04-19 ENCOUNTER — Telehealth: Payer: Self-pay | Admitting: Hematology and Oncology

## 2022-04-19 NOTE — Telephone Encounter (Signed)
R/s pt's new hem appt per provider request. Called pt, no answer. Left msg with new appt date/time. Requested for pt to call back to confirm new appt.

## 2022-04-19 NOTE — Telephone Encounter (Signed)
Pt called back in, stating she was unable to make new appt on 8/24. R/s appt to next available that worked for pt. She is aware of appt date/time.

## 2022-04-20 NOTE — Progress Notes (Signed)
Patty it looks like this needs to be sent to the reading MD for this month.

## 2022-04-26 ENCOUNTER — Inpatient Hospital Stay: Payer: 59

## 2022-04-26 ENCOUNTER — Inpatient Hospital Stay: Payer: 59 | Admitting: Internal Medicine

## 2022-04-29 ENCOUNTER — Other Ambulatory Visit: Payer: 59

## 2022-04-29 ENCOUNTER — Encounter: Payer: 59 | Admitting: Hematology and Oncology

## 2022-05-06 ENCOUNTER — Telehealth: Payer: Self-pay

## 2022-05-06 DIAGNOSIS — D500 Iron deficiency anemia secondary to blood loss (chronic): Secondary | ICD-10-CM

## 2022-05-06 NOTE — Telephone Encounter (Signed)
Per Capsule Endoscopy Report:   Complete capsule endoscopy with poor visualization beyond 2 hrs 15 minutes. 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 There may be other small AVM's not visualized on this study. Continue iron replacement Serial HGB's - repeat now Office follow up with Dr. Fuller Plan

## 2022-05-06 NOTE — Telephone Encounter (Signed)
Left message for patient to call back. Lab order placed.

## 2022-05-06 NOTE — Telephone Encounter (Signed)
Spoke with patient regarding results & recommendations. Patient scheduled for follow up on 07/07/22 at 3:00 pm with Dr. Fuller Plan. She see's the hematology clinic next week & advised that she could have her labs drawn there.

## 2022-05-07 ENCOUNTER — Encounter: Payer: Self-pay | Admitting: Gastroenterology

## 2022-05-11 ENCOUNTER — Encounter: Payer: Self-pay | Admitting: Hematology and Oncology

## 2022-05-11 ENCOUNTER — Other Ambulatory Visit (INDEPENDENT_AMBULATORY_CARE_PROVIDER_SITE_OTHER): Payer: 59

## 2022-05-11 ENCOUNTER — Other Ambulatory Visit: Payer: Self-pay

## 2022-05-11 ENCOUNTER — Inpatient Hospital Stay: Payer: 59 | Attending: Internal Medicine | Admitting: Hematology and Oncology

## 2022-05-11 DIAGNOSIS — D509 Iron deficiency anemia, unspecified: Secondary | ICD-10-CM | POA: Diagnosis present

## 2022-05-11 DIAGNOSIS — K2210 Ulcer of esophagus without bleeding: Secondary | ICD-10-CM | POA: Diagnosis not present

## 2022-05-11 DIAGNOSIS — Z79899 Other long term (current) drug therapy: Secondary | ICD-10-CM | POA: Diagnosis not present

## 2022-05-11 DIAGNOSIS — D500 Iron deficiency anemia secondary to blood loss (chronic): Secondary | ICD-10-CM | POA: Diagnosis not present

## 2022-05-11 LAB — CBC WITH DIFFERENTIAL/PLATELET
Basophils Absolute: 0.1 10*3/uL (ref 0.0–0.1)
Basophils Relative: 0.8 % (ref 0.0–3.0)
Eosinophils Absolute: 0.1 10*3/uL (ref 0.0–0.7)
Eosinophils Relative: 1.4 % (ref 0.0–5.0)
HCT: 43.5 % (ref 36.0–46.0)
Hemoglobin: 14.7 g/dL (ref 12.0–15.0)
Lymphocytes Relative: 27.6 % (ref 12.0–46.0)
Lymphs Abs: 2.3 10*3/uL (ref 0.7–4.0)
MCHC: 33.9 g/dL (ref 30.0–36.0)
MCV: 93.4 fl (ref 78.0–100.0)
Monocytes Absolute: 0.5 10*3/uL (ref 0.1–1.0)
Monocytes Relative: 5.9 % (ref 3.0–12.0)
Neutro Abs: 5.4 10*3/uL (ref 1.4–7.7)
Neutrophils Relative %: 64.3 % (ref 43.0–77.0)
Platelets: 235 10*3/uL (ref 150.0–400.0)
RBC: 4.66 Mil/uL (ref 3.87–5.11)
RDW: 14.7 % (ref 11.5–15.5)
WBC: 8.5 10*3/uL (ref 4.0–10.5)

## 2022-05-11 NOTE — Assessment & Plan Note (Addendum)
She tolerated oral iron supplement well Based on her most recent blood work, she is no longer anemic She is still borderline iron deficient I recommend she continues oral iron supplement daily with plan to recheck it in of next month We will call her with test results The cause of her iron deficiency is due to esophageal ulcer as well as history of blood donation I do not think it is a contraindication for her to donate blood in the future once her underlying cause has been resolved

## 2022-05-11 NOTE — Progress Notes (Signed)
[facility] CONSULT NOTE  Patient Care Team: Donald Prose, MD as PCP - General (Family Medicine)  ASSESSMENT & PLAN:  Iron deficiency anemia She tolerated oral iron supplement well Based on her most recent blood work, she is no longer anemic She is still borderline iron deficient I recommend she continues oral iron supplement daily with plan to recheck it in of next month We will call her with test results The cause of her iron deficiency is due to esophageal ulcer as well as history of blood donation I do not think it is a contraindication for her to donate blood in the future once her underlying cause has been resolved  Esophageal ulcer without bleeding The cause of the esophageal ulcer is unknown She had history of intermittent reflux that has resolved with Protonix We discussed importance of avoidance of NSAID Orders Placed This Encounter  Procedures   Reticulocytes    Standing Status:   Future    Standing Expiration Date:   05/12/2023   CBC with Differential (Cancer Center Only)    Standing Status:   Future    Standing Expiration Date:   05/12/2023   Ferritin    Standing Status:   Future    Standing Expiration Date:   05/11/2023   Iron and Iron Binding Capacity ([facility] only)    Standing Status:   Future    Standing Expiration Date:   05/12/2023    All questions were answered. The patient knows to call the clinic with any problems, questions or concerns.  The total time spent in the appointment was 55 minutes encounter with patients including review of chart and various tests results, discussions about plan of care and coordination of care plan  Heath Lark, MD 9/5/20233:19 PM   CHIEF COMPLAINTS/PURPOSE OF CONSULTATION:  Anemia  HISTORY OF PRESENTING ILLNESS:  Cassandra Barnett 61 y.o. female is here because of anemia  She was found to have abnormal CBC from blood count check with her primary care doctor I did not have her historical CBC According to the  patient, her hemoglobin was around 11 earlier this year and then it dropped to 9 She tested positive for Hemoccult testing and was referred to GI service and was evaluated with EGD and colonoscopy She was placed on oral iron supplement daily as well as Protonix  On 02/26/2022, she underwent EGD which showed  - LA Grade B (one or more mucosal breaks greater than 5 mm, not extending between the tops of two mucosal folds) esophagitis with an associated ulcer with no bleeding was found 34 cm from the incisors. Biopsies were taken with a cold forceps for histology. Estimated blood loss was minimal. Findings: - Diffuse minimal inflammation characterized by erythema and granularity was found in the gastric body. Biopsies were taken from the antrum, body, and fundus with a cold forceps for histology. Estimated blood loss was minimal. - The examined duodenum was normal. Biopsies were taken with a cold forceps for histology. Estimated blood loss was minimal. - The cardia and gastric fundus were normal on retroflexion. - The exam was otherwise without abnormality.  Colonoscopy on 02/26/22 showed  - The perianal and digital rectal examinations were normal except for internal and external hemorrhoids. Findings: - Many small and large-mouthed diverticula were found in the sigmoid colon and descending colon. - A 2 mm polyp was found in the sigmoid colon. The polyp was sessile. The polyp was removed with a cold snare. Resection and retrieval were complete. Estimated  blood loss was minimal. - A 3 mm polyp was found in the distal transverse colon. The polyp was sessile. The polyp was removed with a cold snare. Resection and retrieval were complete. Estimated blood loss was minimal. - A less than 1 mm polyp was found in the cecum. The polyp was sessile. The polyp was removed with a cold snare. Resection and retrieval were complete. Estimated blood loss was minimal. - The terminal ileum appeared normal. - The exam  was otherwise without abnormality on direct and retroflexion views  Pathology report showed  1. Surgical [P], small bowel BENIGN DUODENAL MUCOSA WITH NO DIAGNOSTIC ABNORMALITY 2. 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 PYLORI, INTESTINAL METAPLASIA, DYSPLASIA AND CARCINOMA 3. Surgical [P], esophagus REACTIVE SQUAMOUS MUCOSA WITH FOCAL ACUTE INFLAMMATION SCANT BENIGN FOVEOLAR EPITHELIUM NEGATIVE FOR INTESTINAL METAPLASIA, DYSPLASIA AND CARCINOMA 4. Surgical [P], colon, cecum, polyp (1) HYPERPLASTIC POLYP (SEE MICROSCOPIC COMMENT) NEGATIVE FOR DYSPLASIA AND CARCINOMA 5. Surgical [P], colon, transverse, polyp (1) TUBULAR ADENOMA NEGATIVE FOR HIGH-GRADE DYSPLASIA AND CARCINOMA 6. Surgical [P], colon, sigmoid, polyp (1) TUBULAR ADENOMA NEGATIVE FOR HIGH-GRADE DYSPLASIA AND CARCINOMA  She denies recent chest pain on exertion, shortness of breath on minimal exertion, pre-syncopal episodes, or palpitations now but when she was found to be anemic, she has some symptoms of fatigue She had not noticed any recent bleeding such as epistaxis, hematuria or hematochezia The patient has taken ibuprofen occasionally but this is not on a routine basis. She is not on antiplatelets agents.  She had no prior history or diagnosis of cancer. Her age appropriate screening programs are up-to-date. She denies any pica and eats a variety of diet. She has been donating blood for over 40 years, usually 3-4 times a year.  She donated blood earlier this year.  She has never received blood transfusion The patient was prescribed oral iron supplements and she takes daily in the morning around 15 to 20 minutes before breakfast  MEDICAL HISTORY:  Past Medical History:  Diagnosis Date   ADD (attention deficit disorder)    Anemia    Anxiety    Degenerative disc disease, cervical    GERD (gastroesophageal reflux disease)    Hypertension     SURGICAL HISTORY: Past  Surgical History:  Procedure Laterality Date   bladder tack      SOCIAL HISTORY: Social History   Socioeconomic History   Marital status: Married    Spouse name: Not on file   Number of children: 1   Years of education: Not on file   Highest education level: Not on file  Occupational History   Occupation: Investment banker, corporate  Tobacco Use   Smoking status: Never   Smokeless tobacco: Never  Vaping Use   Vaping Use: Never used  Substance and Sexual Activity   Alcohol use: Yes    Alcohol/week: 5.0 standard drinks of alcohol    Types: 5 Glasses of wine per week    Comment: 1 per day   Drug use: No   Sexual activity: Not on file  Other Topics Concern   Not on file  Social History Narrative   Not on file   Social Determinants of Health   Financial Resource Strain: Not on file  Food Insecurity: Not on file  Transportation Needs: Not on file  Physical Activity: Not on file  Stress: Not on file  Social Connections: Not on file  Intimate Partner Violence: Not on file    FAMILY HISTORY: Family History  Problem Relation Age of Onset   Hypertension Mother    Osteoporosis Mother    Skin cancer Father    Hypertension Father    Cataracts Father    Macular degeneration Sister    Skin cancer Maternal Grandfather    Other Paternal Grandmother        [email or web address]   Fibromyalgia Daughter    Anxiety disorder Daughter    GER disease Daughter    Colon cancer Neg Hx     ALLERGIES:  is allergic to amoxicillin.  MEDICATIONS:  Current Outpatient Medications  Medication Sig Dispense Refill   ALPRAZolam (XANAX) 0.25 MG tablet Take 0.25 mg by mouth 3 (three) times daily as needed.     escitalopram (LEXAPRO) 20 MG tablet escitalopram 20 mg tablet     ferrous sulfate 325 (65 FE) MG EC tablet Take 325 mg by mouth 2 (two) times daily.     lisinopril-hydrochlorothiazide (PRINZIDE,ZESTORETIC) 20-25 MG per tablet      pantoprazole (PROTONIX) 40 MG tablet Take 1 tablet (40 mg total) by  mouth daily. For at least 8 weeks, then resume to 40 mg. Every morning. (Patient taking differently: Take 40 mg by mouth 2 (two) times daily. For at least 8 weeks, then resume to 40 mg. Every morning.) 60 tablet 3   zolpidem (AMBIEN) 10 MG tablet Take 10 mg by mouth at bedtime as needed for sleep.     Current Facility-Administered Medications  Medication Dose Route Frequency Provider Last Rate Last Admin   0.9 %  sodium chloride infusion  500 mL Intravenous Once [place], MD        REVIEW OF SYSTEMS:   Constitutional: Denies fevers, chills or abnormal night sweats Eyes: Denies blurriness of vision, double vision or watery eyes Ears, nose, mouth, throat, and face: Denies mucositis or sore throat Respiratory: Denies cough, dyspnea or wheezes Cardiovascular: Denies palpitation, chest discomfort or lower extremity swelling Gastrointestinal:  Denies nausea, heartburn or change in bowel habits Skin: Denies abnormal skin rashes Lymphatics: Denies new lymphadenopathy or easy bruising Neurological:Denies numbness, tingling or new weaknesses Behavioral/Psych: Mood is stable, no new changes  All other systems were reviewed with the patient and are negative.  PHYSICAL EXAMINATION: ECOG PERFORMANCE STATUS: 0 - Asymptomatic  Vitals:   05/11/22 1319  BP: 130/84  Pulse: 82  Resp: 15  Temp: 99.2 F (37.3 C)  SpO2: 97%   Filed Weights   05/11/22 1319  Weight: 175 lb 3.2 oz (79.5 kg)    GENERAL:alert, no distress and comfortable SKIN: skin color, texture, turgor are normal, no rashes or significant lesions EYES: normal, conjunctiva are pink and non-injected, sclera clear OROPHARYNX:no exudate, no erythema and lips, buccal mucosa, and tongue normal  NECK: supple, thyroid normal size, non-tender, without nodularity LYMPH:  no palpable lymphadenopathy in the cervical, axillary or inguinal LUNGS: clear to auscultation and percussion with normal breathing effort HEART: regular rate &  rhythm and no murmurs and no lower extremity edema ABDOMEN:abdomen soft, non-tender and normal bowel sounds Musculoskeletal:no cyanosis of digits and no clubbing  PSYCH: alert & oriented x 3 with fluent speech NEURO: no focal motor/sensory deficits

## 2022-05-11 NOTE — Assessment & Plan Note (Signed)
The cause of the esophageal ulcer is unknown She had history of intermittent reflux that has resolved with Protonix We discussed importance of avoidance of NSAID

## 2022-07-01 ENCOUNTER — Telehealth: Payer: Self-pay | Admitting: Hematology and Oncology

## 2022-07-01 ENCOUNTER — Inpatient Hospital Stay: Payer: 59 | Attending: Internal Medicine

## 2022-07-01 ENCOUNTER — Other Ambulatory Visit: Payer: Self-pay

## 2022-07-01 DIAGNOSIS — K2210 Ulcer of esophagus without bleeding: Secondary | ICD-10-CM | POA: Diagnosis present

## 2022-07-01 DIAGNOSIS — D500 Iron deficiency anemia secondary to blood loss (chronic): Secondary | ICD-10-CM | POA: Diagnosis present

## 2022-07-01 LAB — CBC WITH DIFFERENTIAL (CANCER CENTER ONLY)
Abs Immature Granulocytes: 0.01 10*3/uL (ref 0.00–0.07)
Basophils Absolute: 0.1 10*3/uL (ref 0.0–0.1)
Basophils Relative: 1 %
Eosinophils Absolute: 0.2 10*3/uL (ref 0.0–0.5)
Eosinophils Relative: 3 %
HCT: 44.3 % (ref 36.0–46.0)
Hemoglobin: 15.4 g/dL — ABNORMAL HIGH (ref 12.0–15.0)
Immature Granulocytes: 0 %
Lymphocytes Relative: 29 %
Lymphs Abs: 2.1 10*3/uL (ref 0.7–4.0)
MCH: 33.2 pg (ref 26.0–34.0)
MCHC: 34.8 g/dL (ref 30.0–36.0)
MCV: 95.5 fL (ref 80.0–100.0)
Monocytes Absolute: 0.5 10*3/uL (ref 0.1–1.0)
Monocytes Relative: 7 %
Neutro Abs: 4.5 10*3/uL (ref 1.7–7.7)
Neutrophils Relative %: 60 %
Platelet Count: 226 10*3/uL (ref 150–400)
RBC: 4.64 MIL/uL (ref 3.87–5.11)
RDW: 12.4 % (ref 11.5–15.5)
WBC Count: 7.4 10*3/uL (ref 4.0–10.5)
nRBC: 0 % (ref 0.0–0.2)

## 2022-07-01 LAB — IRON AND IRON BINDING CAPACITY (CC-WL,HP ONLY)
Iron: 117 ug/dL (ref 28–170)
Saturation Ratios: 31 % (ref 10.4–31.8)
TIBC: 382 ug/dL (ref 250–450)
UIBC: 265 ug/dL (ref 148–442)

## 2022-07-01 LAB — RETICULOCYTES
Immature Retic Fract: 10.2 % (ref 2.3–15.9)
RBC.: 4.67 MIL/uL (ref 3.87–5.11)
Retic Count, Absolute: 91.1 10*3/uL (ref 19.0–186.0)
Retic Ct Pct: 2 % (ref 0.4–3.1)

## 2022-07-01 LAB — FERRITIN: Ferritin: 46 ng/mL (ref 11–307)

## 2022-07-01 NOTE — Telephone Encounter (Signed)
Attempted to call the patient with test results, left her a voicemail Repeat CBC showed no evidence of anemia and she has good response to oral iron supplement She does not need long-term follow-up with me

## 2022-07-07 ENCOUNTER — Encounter: Payer: Self-pay | Admitting: Gastroenterology

## 2022-07-07 ENCOUNTER — Ambulatory Visit: Payer: 59 | Admitting: Gastroenterology

## 2022-07-07 VITALS — BP 141/92 | HR 72 | Ht 61.0 in | Wt 177.0 lb

## 2022-07-07 DIAGNOSIS — K2100 Gastro-esophageal reflux disease with esophagitis, without bleeding: Secondary | ICD-10-CM | POA: Diagnosis not present

## 2022-07-07 DIAGNOSIS — D500 Iron deficiency anemia secondary to blood loss (chronic): Secondary | ICD-10-CM | POA: Diagnosis not present

## 2022-07-07 DIAGNOSIS — Z86010 Personal history of colonic polyps: Secondary | ICD-10-CM | POA: Diagnosis not present

## 2022-07-07 NOTE — Patient Instructions (Signed)
Patient advised to avoid spicy, acidic, citrus, chocolate, mints, fruit and fruit juices.  Limit the intake of caffeine, alcohol and Soda.  Don't exercise too soon after eating.  Don't lie down within 3-4 hours of eating.  Elevate the head of your bed.  You can take over the counter Nexium in the evening as needed for break through reflux.   The  GI providers would like to encourage you to use [facility] to communicate with providers for non-urgent requests or questions.  Due to long hold times on the telephone, sending your provider a message by [facility] may be a faster and more efficient way to get a response.  Please allow 48 business hours for a response.  Please remember that this is for non-urgent requests.   Thank you for choosing me and [facility].  Pricilla Riffle. Dagoberto Ligas., MD., Marval Regal

## 2022-07-07 NOTE — Progress Notes (Signed)
    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 Hgb, ferritin now normal GERD with LA Class B esophagitis, reactive gastropathy Personal history of adenomatous colon polyps   Recommendations    Monitor CBC and iron studies q3 months to start, per PCP Continue pantoprazole 40 mg qam, Nexium 20 mg po qpm prn and follow antireflux measures Surveillance colonoscopy in 7 years, June 2030 REV in 1 year   HPI    This is a 61 year old female with iron deficiency anemia, Hemoccult positive stool.  Evaluation with colonoscopy, EGD and VCE as outlined below.  Her hemoglobin and ferritin are now normal.  Her reflux symptoms are well controlled on daily pantoprazole.  VCE Aug 2023 2 small AVMs @ 14 min and 1 hr 40mn Poor visualization beyond 2 hr 25 min  EGD June 2023 - LA Grade B esophagitis with no bleeding. Biopsied. - Gastritis. Biopsied. - Normal examined duodenum. Biopsied. - The examination was otherwise normal. Path: reactive gastropathy, normal duodenum, esophagitis  Colonoscopy June 2023 - Diverticulosis in the sigmoid colon and in the descending colon. - One 2 mm polyp in the sigmoid colon, removed with a cold snare. Resected and retrieved. - One 3 mm polyp in the distal transverse colon, removed with a cold snare. Resected and retrieved. - One less than 1 mm polyp in the cecum, removed with a cold snare. Resected and retrieved. - The examined portion of the ileum was normal. - Small internal and external hemorrhoids. - The examination was otherwise normal on direct and retroflexion views. Path: 2 TAs, 1 HP   Labs / Imaging        No data to display             Latest Ref Rng & Units 07/01/2022    8:00 AM 05/11/2022    1:49 PM 03/26/2022    9:45 AM  CBC  WBC 4.0 - 10.5 K/uL 7.4  8.5  6.6   Hemoglobin 12.0 - 15.0 g/dL 15.4  14.7  14.0   Hematocrit 36.0 - 46.0 % 44.3  43.5  42.5   Platelets 150 - 400 K/uL 226  235.0  216.0      Current Medications, Allergies, Past Medical History, Past Surgical History, Family History and Social History were reviewed in [employer].   Physical Exam: General: Well developed, well nourished, no acute distress Head: Normocephalic and atraumatic Eyes: Sclerae anicteric, EOMI Ears: Normal auditory acuity Mouth: Not examined Lungs: Clear throughout to auscultation Heart: Regular rate and rhythm; no murmurs, rubs or bruits Abdomen: Soft, non tender and non distended. No masses, hepatosplenomegaly or hernias noted. Normal Bowel sounds Rectal: Not done Musculoskeletal: Symmetrical with no gross deformities  Pulses:  Normal pulses noted Extremities: No clubbing, cyanosis, edema or deformities noted Neurological: Alert oriented x 4, grossly nonfocal Psychological:  Alert and cooperative. Normal mood and affect   Cassandra Barnett T. SFuller Plan MD 07/07/2022, 3:06 PM

## 2022-09-24 ENCOUNTER — Other Ambulatory Visit (HOSPITAL_BASED_OUTPATIENT_CLINIC_OR_DEPARTMENT_OTHER): Payer: Self-pay

## 2022-09-24 ENCOUNTER — Other Ambulatory Visit (HOSPITAL_COMMUNITY): Payer: Self-pay

## 2022-09-24 MED ORDER — ERYTHROMYCIN 5 MG/GM OP OINT
TOPICAL_OINTMENT | OPHTHALMIC | 0 refills | Status: AC
  Filled 2022-09-24: qty 3.5, 7d supply, fill #0
